# Patient Record
Sex: Female | Born: 1980 | Race: White | Hispanic: No | Marital: Single | Smoking: Current every day smoker
Health system: Southern US, Community
[De-identification: ages and names within clinical notes are randomized; demographics above are authoritative.]

## PROBLEM LIST (undated history)

## (undated) DIAGNOSIS — R569 Unspecified convulsions: Secondary | ICD-10-CM

## (undated) DIAGNOSIS — I82409 Acute embolism and thrombosis of unspecified deep veins of unspecified lower extremity: Secondary | ICD-10-CM

---

## 2011-07-30 ENCOUNTER — Emergency Department: Payer: Self-pay | Admitting: Emergency Medicine

## 2011-10-27 ENCOUNTER — Emergency Department: Payer: Self-pay | Admitting: Unknown Physician Specialty

## 2012-02-29 ENCOUNTER — Emergency Department: Payer: Self-pay | Admitting: *Deleted

## 2012-09-26 ENCOUNTER — Emergency Department: Payer: Self-pay | Admitting: Emergency Medicine

## 2012-09-26 LAB — RAPID INFLUENZA A&B ANTIGENS

## 2012-10-19 ENCOUNTER — Emergency Department: Payer: Self-pay | Admitting: Emergency Medicine

## 2013-06-29 ENCOUNTER — Encounter (HOSPITAL_COMMUNITY): Payer: Self-pay | Admitting: Emergency Medicine

## 2013-06-29 ENCOUNTER — Emergency Department (HOSPITAL_COMMUNITY)
Admission: EM | Admit: 2013-06-29 | Discharge: 2013-06-29 | Disposition: A | Discharge status: Home / Self Care | Payer: Self-pay | Attending: Emergency Medicine | Admitting: Emergency Medicine

## 2013-06-29 DIAGNOSIS — Z79899 Other long term (current) drug therapy: Secondary | ICD-10-CM | POA: Insufficient documentation

## 2013-06-29 DIAGNOSIS — IMO0001 Reserved for inherently not codable concepts without codable children: Secondary | ICD-10-CM | POA: Insufficient documentation

## 2013-06-29 DIAGNOSIS — Z86718 Personal history of other venous thrombosis and embolism: Secondary | ICD-10-CM | POA: Insufficient documentation

## 2013-06-29 DIAGNOSIS — M25469 Effusion, unspecified knee: Secondary | ICD-10-CM | POA: Insufficient documentation

## 2013-06-29 DIAGNOSIS — I809 Phlebitis and thrombophlebitis of unspecified site: Secondary | ICD-10-CM | POA: Insufficient documentation

## 2013-06-29 DIAGNOSIS — Z8669 Personal history of other diseases of the nervous system and sense organs: Secondary | ICD-10-CM | POA: Insufficient documentation

## 2013-06-29 DIAGNOSIS — Z791 Long term (current) use of non-steroidal anti-inflammatories (NSAID): Secondary | ICD-10-CM | POA: Insufficient documentation

## 2013-06-29 DIAGNOSIS — Z86711 Personal history of pulmonary embolism: Secondary | ICD-10-CM | POA: Insufficient documentation

## 2013-06-29 DIAGNOSIS — M79609 Pain in unspecified limb: Secondary | ICD-10-CM

## 2013-06-29 DIAGNOSIS — F17200 Nicotine dependence, unspecified, uncomplicated: Secondary | ICD-10-CM | POA: Insufficient documentation

## 2013-06-29 HISTORY — DX: Acute embolism and thrombosis of unspecified deep veins of unspecified lower extremity: I82.409

## 2013-06-29 HISTORY — DX: Unspecified convulsions: R56.9

## 2013-06-29 MED ORDER — RIVAROXABAN 15 MG PO TABS: 15.0000 mg | ORAL_TABLET | Freq: Two times a day (BID) | ORAL | Status: DC

## 2013-06-29 MED ORDER — NAPROXEN 500 MG PO TABS: 500.0000 mg | ORAL_TABLET | Freq: Two times a day (BID) | ORAL | Status: DC

## 2013-06-29 NOTE — ED Notes (Signed)
Pt states a week ago had pain and lump to right leg posterior to knee, pain progressively got worse and traveled down towards foot.

## 2013-06-29 NOTE — ED Provider Notes (Signed)
CSN: [id number]     Arrival date & time 06/29/13  1321 History   First MD Initiated Contact with Patient 06/29/13 1632     Chief Complaint  Patient presents with  . Leg Pain   (Consider location/radiation/quality/duration/timing/severity/associated sxs/prior Treatment) The history is provided by the patient and medical records. No language interpreter was used.    Morgan Harding is a 32 y.o. 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  Pt states. She began with a lump on the right posterior leg one week ago which has progressively gotten worse and traveled down to the foot. Nothing makes the symptoms better or worse.  Pt denies fever, chills, headache, CP, SOB, palpitations, abd pain, nausea, vomiting, diarrhea, weakness, dizziness, syncope. Pt without PCP.     Past Medical History  Diagnosis Date  . Seizures   . DVT (deep venous thrombosis)    History reviewed. No pertinent past surgical history. History reviewed. No pertinent family history. History  Substance Use Topics  . Smoking status: Current Every Day Smoker  . Smokeless tobacco: Not on file  . Alcohol Use: Yes     Comment: occ   OB History   Grav Para Term Preterm Abortions TAB SAB Ect Mult Living                 Review of Systems  Constitutional: Negative for fever, diaphoresis, appetite change, fatigue and unexpected weight change.  HENT: Negative for mouth sores and neck stiffness.   Eyes: Negative for visual disturbance.  Respiratory: Negative for cough, chest tightness, shortness of breath and wheezing.   Cardiovascular: Positive for leg swelling. Negative for chest pain.  Gastrointestinal: Negative for nausea, vomiting, abdominal pain, diarrhea and constipation.  Endocrine: Negative for  polydipsia, polyphagia and polyuria.  Genitourinary: Negative for dysuria, urgency, frequency and hematuria.  Musculoskeletal: Positive for myalgias. Negative for back pain.  Skin: Positive for color change. Negative for rash.  Allergic/Immunologic: Negative for immunocompromised state.  Neurological: Negative for syncope, light-headedness and headaches.  Hematological: Does not bruise/bleed easily.  Psychiatric/Behavioral: Negative for sleep disturbance. The patient is not nervous/anxious.     Allergies  Review of patient's allergies indicates no known allergies.  Home Medications   Current Outpatient Rx  Name  Route  Sig  Dispense  Refill  . folic acid (FOLVITE) 800 MCG tablet   Oral   Take 800 mcg by mouth daily.         . Multiple Vitamin (MULTIVITAMIN WITH MINERALS) TABS tablet   Oral   Take 1 tablet by mouth daily.         Marland Kitchen omeprazole (PRILOSEC) 20 MG capsule   Oral   Take 20 mg by mouth daily.         . naproxen (NAPROSYN) 500 MG tablet   Oral   Take 1 tablet (500 mg total) by mouth 2 (two) times daily with a meal.   30 tablet   0   . Rivaroxaban (XARELTO) 15 MG TABS tablet   Oral   Take 1 tablet (15 mg total) by mouth 2 (two) times daily.   30 tablet   0    BP 107/44  Pulse 85  Temp(Src) 98.2 F (36.8 C) (Oral)  Resp 20  SpO2 97% Physical Exam  Nursing note  and vitals reviewed. Constitutional: She is oriented to person, place, and time. She appears well-developed and well-nourished. No distress.  Awake, alert, nontoxic appearance  HENT:  Head: Normocephalic and atraumatic.  Nose: Nose normal.  Mouth/Throat: Uvula is midline, oropharynx is clear and moist and mucous membranes are normal. Mucous membranes are not dry. No oropharyngeal exudate, posterior oropharyngeal edema, posterior oropharyngeal erythema or tonsillar abscesses.  Eyes: Conjunctivae are normal. Pupils are equal, round, and reactive to light. No scleral icterus.  Neck: Normal range  of motion. Neck supple.  Cardiovascular: Normal rate, regular rhythm, normal heart sounds and intact distal pulses.   No murmur heard. No tachycardia - HR 88 on exam  Pulmonary/Chest: Effort normal and breath sounds normal. No respiratory distress. She has no wheezes. She has no rales.  No tachypnea  Abdominal: Soft. Bowel sounds are normal. She exhibits no distension. There is no tenderness. There is no rebound.  obese  Musculoskeletal: Normal range of motion. She exhibits edema and tenderness.       Right lower leg: She exhibits tenderness, swelling and edema. She exhibits no bony tenderness, no deformity and no laceration.  Mild pain to palpation of the medial right calf;  mild nonpitting edema of the right lower leg  Lymphadenopathy:    She has no cervical adenopathy.  Neurological: She is alert and oriented to person, place, and time. She exhibits normal muscle tone. Coordination normal.  Speech is clear and goal oriented Moves extremities without ataxia  Skin: Skin is warm and dry. She is not diaphoretic. There is erythema.  5cm x 8 cm area of mild erythema on the medial right calf without induration, fluctuance, drainage or evidence of abscess  Psychiatric: She has a normal mood and affect.    ED Course  Procedures (including critical care time) Labs Review Labs Reviewed - No data to display Imaging Review No results found.  Venous duplex with: Right: Superficial thrombosis noted in the GSV from prox/mid thigh to mid calf. No evidence of DVT.   MDM   1. Superficial thrombophlebitis    [name] presents with concerns for a DVT due to right leg pain with mild swelling x 1 week with hx of the same.    Pt without tachycardia, hypoxia, CP, SOB or hemoptysis; no concern for PE.  Superficial thrombosis found in the great saphenous vein extending from mid thigh to calf.  Patient with history of DVT with PE and significant superficial thrombosed by this in the great saphenous  vein gives rise to concern for possible development of DVT. Will begin on Xarelto and have her followup with the community wellness clinic for further evaluation and treatment options. I have also discussed reasons to return immediately to the ER.  Patient expresses understanding and agrees with plan.  BP 107/44  Pulse 85  Temp(Src) 98.2 F (36.8 C) (Oral)  Resp 20  SpO2 97%    Dierdre Forth, PA-C 06/29/13 1820

## 2013-06-29 NOTE — ED Notes (Signed)
Pt c/o right leg pain with some swelling x 1 week; pt sts hx of DVT and feels same

## 2013-06-29 NOTE — Progress Notes (Signed)
VASCULAR LAB PRELIMINARY  PRELIMINARY  PRELIMINARY  PRELIMINARY  Right lower extremity venous duplex completed.    Preliminary report:  Right:  Superficial thrombosis noted in the GSV from prox/mid thigh to mid calf.   No evidence of DVT.  Alitza Cowman, RVT 06/29/2013, 2:49 PM

## 2013-06-29 NOTE — ED Provider Notes (Signed)
Medical screening examination/treatment/procedure(s) were performed by non-physician practitioner and as supervising physician I was immediately available for consultation/collaboration.   Glynn Octave, MD 06/29/13 [phone]

## 2013-06-29 NOTE — Discharge Instructions (Signed)
1. Medications: naprosyn; usual home medications 2. Treatment: rest, drink plenty of fluids, extremity elevation (to waist level), warm compresses; NSAIDs; continue to remain ambulatory  3. Follow Up: Please followup with your primary doctor for discussion of your diagnoses and further evaluation after today's visit; if you do not have a primary care doctor use the resource guide provided to find one;     Phlebitis Phlebitis is a redness, tenderness and soreness (inflammation) in a vein. This can occur in your arms, legs, or torso (trunk), as well as deeper inside your body.  CAUSES  Phlebitis can be triggered by multiple factors. These include:  Reduced (restricted) blood flow through your veins. This happens with prolonged bed rest, long distance travel, injury or surgery. Being overweight (obese) and pregnant can also restrict blood flow and lead to phlebitis.  Putting a catheter in the vein (intravenous or IV) and giving certain medications through in the vein (intravenously).  Cancer and cancer treatment.  Use of illegal intravenous drugs.  Inflammatory diseases.  Inherited (genetic) diseases that increase the risk for blood clots.  Hormone therapy (such as birth control pills). SYMPTOMS   Red, tender, swollen, painful area on your skin.  Usually, the area will be long and narrow.  Low grade fever.  Significant firmness along the center of this area. This can indicate that a blood clot has formed.  Surrounding redness or a high fever, which can indicate an infection (cellulitis). DIAGNOSIS   The appearance of your condition and your symptoms will cause your caregiver to suspect phlebitis. Usually, this is enough for a diagnosis.  Your caregiver may request blood tests or an ultrasound test of the area to be sure you do not have an infection or a blood clot. Blood tests and discussing your family history may also indicate if you have an underlying genetic disease that  causes blood clots.  Occasionally, a piece of tissue is taken from the body (biopsy) if an unusual cause of phlebitis is suspected. TREATMENT   Raise (elevate) the affected area above the level of the heart.  Apply a warm compress or heating pad for 20 minutes, 3 or 4 times a day. If you use an electric heating pad, follow the directions so you do not burn yourself.  Anti-inflammatory medications are usually recommended. Follow your caregiver's directions.  Any IV catheter, if present, will be removed by your caregiver.  Your caregiver may prescribe medicines that kill germs (antibiotics) if an infection is present.  Your caregiver may recommend blood thinners if a blood clot is suspected or present.  Support stockings or bandages may be helpful, depending on the cause and location of the phlebitis.  Surgery may be needed to remove very damaged sections of vein, but this is rare. HOME CARE INSTRUCTIONS   Take medications exactly as prescribed.  Follow up with your caregiver as directed.  Use support stockings or bandages if advised. These will speed healing and prevent recurrence.  If you are on blood thinners:  Do follow-up blood tests exactly as directed.  Check with your caregiver before using any new medications.  Wear a pendant to show that you are on blood thinners.  For phlebitis in the legs:  Avoid prolonged standing or bed rest.  Keep your legs moving. Raise your legs with sitting or lying.  Do not smoke.  Women, particularly those over the age of 15, should consider the risks and benefits of taking the contraceptive pill. This kind of hormone treatment can  increase your risk for blood clots. SEEK MEDICAL CARE IF:   You have unusual bruising or any bleeding problems.  Swelling or pain in your affected arm or leg is not gradually improving.  You are on anti-inflammatory medication and you develop belly (abdominal) pain. SEEK IMMEDIATE MEDICAL CARE IF:    An unexplained oral temperature above 100.5 F (38.1 C) develops.  You have sudden onset of chest pain or difficulty breathing. Document Released: 10/15/2001 Document Revised: 01/13/2012 Document Reviewed: 07/17/2009 [facility] Patient Information 2014 [place], [place].   RESOURCE GUIDE  Chronic Pain Problems: Contact [facility]  [phone] Patients need to be referred by their primary care doctor.  Insufficient Money for Medicine: Contact United Way:  call [phone]  No Primary Care Doctor: - Call Health Connect  [phone] - can help you locate a primary care doctor that  accepts your insurance, provides certain services, etc. - Physician Referral Service- [phone]  Agencies that provide inexpensive medical care: - [facility]  [phone] - [facility]  [phone] - [facility]  [phone] - Women's Clinic  [phone] - Planned Parenthood  [phone] [facility]  [phone]  Medicaid-accepting [facility] Providers: - [facility]- [address]  [phone], Mon-Fri 9am-7pm, Sat 9am-1pm - [facility]- [address] [place], Suite [place]  [phone] - [facility]- [address], [address] [place]  [phone] [facility]- [address]  [phone] - Renaye Rakers- [address] [place], [address], [phone]  Only accepts [place] patients after they have their name  applied to their card  Self Pay (no insurance) in [place]: - Sickle Cell Patients - [facility]  [address] [place], [phone] - [facility]- [address] [place]  [phone]       [facility] [place]- [address]       -     [facility]- see information above (Speak to [employer] if you do not have insurance)       -  [facility]- [address],   [phone]       -  [facility]- [address] [phone]       -  Dr Julio Sicks-  [address], [place], [phone]       -  [facility] - [address], [phone]       -  [facility] [place] [facility]- [address] [phone], also [address], [phone]       -     [facility]- [address] [place], [phone], 1st & 3rd Saturday         every month, 10am-1pm  -     [facility]   [address] [place], [place].   Phone:  [phone], Fax:  [phone]. Hours of Operation:  9 am - 6 pm, M-F.  -     [facility]   [address],    Phone: [phone], Fax: [phone]. Hours of Operation:  8:30 am - 5:30 pm, M-F.  [facility] [address] [place], [place] [address] [phone]  [facility] [address] [place], [place]  [address] [phone]  1) Find a Doctor and Pay Out of Pocket Although you won't have to find out who is covered by your insurance plan, it is a good idea to ask around and get recommendations. You will then need to call the office and see if the doctor you have chosen will accept you as a new patient and what types of options they offer for patients who are self-pay. Some doctors offer discounts or will set up payment plans for their patients who do not have insurance, but you will need to ask so you aren't surprised when you get to your appointment.  2) Contact Your Local Health Department Not all health departments have doctors that can see patients for sick visits, but many do, so it is worth a call to see if yours does. If you don't know where your local health department is, you can check in your phone book. The CDC also has a tool to help you locate your state's health department, and many state websites also have listings of all of their local health departments.  3) Find a Walk-in  Clinic If your illness is not likely to be very severe or complicated, you may want to try a walk in clinic. These are popping up all over the country in pharmacies, drugstores, and shopping centers. They're usually staffed by nurse practitioners or physician assistants that have been trained to treat common illnesses and complaints. They're usually fairly quick and inexpensive. However, if you have serious medical issues or chronic medical problems, these are probably not your best option  STD Testing - [facility] Department of [facility] [place], STD Clinic, [address] [place], phone [phone] or [phone].  Monday - Friday, call for an appointment. [facility] Department of [employer], STD Clinic, [address], [place], phone [phone] or [phone].  Monday - Friday, call for an appointment.  Abuse/Neglect: [facility] Child Abuse Hotline 605-135-4032 [facility] Child Abuse Hotline 253-015-8295 (After Hours)  Emergency Shelter:  [facility] [phone]  Maternity Homes: - [facility] [place] [facility] [phone] - [facility] [phone]  MRSA Hotline #:   469-648-6186  Dental Assistance If unable to pay or uninsured, contact:  [facility]. to become qualified for the adult dental clinic.  Patients with Medicaid: [facility] [phone] [address], [phone] [address] [phone]  If unable to pay, or uninsured, contact [facility] [phone] in [place], [phone] in [facility]) to become qualified for the adult dental clinic  [facility] [address] [place], [place] [address] [phone] [email or web address]  Other Proofreader Services: - [facility]- [address] [place], [place], [place], [address], [phone], Ext. 123, 2nd and 4th Thursday of the month at 6:30am.  10 clients each  day by appointment, can sometimes see walk-in patients if someone does not show for an appointment. [facility]- [address] [place], [place], [address], [phone] - [facility] [address] [place], [place], [address], [phone] - [facility]- [phone] [facility] Health Department- [phone] [facility] Health [phone]       Behavioral Health Resources in the [facility]  Intensive Outpatient Programs: [facility]      [address] [place], [place] [phone] Both a day and evening program  [facility] Outpatient     [address]        [place], [place] [address] [phone]         ADS: [facility] Drug S[facility] [address] [place] [phone]  [facility] Mental Health ACCESS LINE: 331-116-1997 or [phone] [place], [place] [address] [email or web address]   Substance Abuse Resources: - Alcohol and Drug Services  [phone]) [phone] - [facility] [phone] - [facility] [phone] Floydene Flock ([phone] - Residential & Outpatient Substance Abuse Program  ([phone]  Psychological Services: [facility]  [phone] [facility]  [phone] - [facility], [phone] [place]. [address] [place], ACCESS LINE: 4042578605 or 3010448960, [email or web address]  Mobile Crisis Teams:                                        Therapeutic Alternatives         Mobile Crisis Care Unit [phone]             [facility] [address] Ginette Otto [phone]                                         Interventionist [address] [place] [address] [place] [phone]  Self-Help/Support Groups: Mental Health Assoc. of [employer] of support  groups (918)687-3761 (call for more info)  Narcotics Anonymous (NA) [facility]aring [facility] [address] [place] - 2 meetings at this location  Residential Treatment Programs:  [facility]      [address]        [place]       [phone]         [facility] [address] [place] [address] [place], [place]  [address] [phone]  [facility]  [address] [place], [place] [address] [phone] Admissions: 8am-3pm M-F  [facility]     [address]        [place], [place] [address]       [phone]         [facility] [address] [place], [place] [phone]  [facility] [address] [place], [place] [phone]  [facility] - Intensive Outpatient      [address]     [place], [place]       [phone]         [facility] ([facility]     [address] [place], [place] [phone] or [phone]  Residential Treatment Services (RTS), Medicaid [address] [place], [place] [phone]  Fellowship [address]                                               [address] [place] [phone]  [facility] Resources: Estate manager/land agent [phone]               General Therapy  Angie Fava, PhD        [address] [place], [place] [address]         [phone]   Insurance  [facility]   [address] [place], [place] [address] [phone]  [facility] [address] [place], [place] [address] [phone] Insurance/Medicaid/sponsorship through [facility] and Families                                              [address]                                        [place], [place] [address]    Therapy/tele-psych/case         [phone]          [facility] [address], [place]  [address]  Adolescent/group home/case management [phone]                                           Creola Corn PhD       General therapy       Insurance   [phone]         Dr. Lolly Mustache, Insurance, M-F [phone]  [facility]  [employer] [facility] Dept. [address]                 [address]         [address] [place] [address]  Blondell Reveal Phone:  [phone]                                  Phone:  [phone]                   Phone:  [phone]  [facility] Mental Health, [phone] - [facility]- [phone]       -     [facility] in [place], [address],             [phone], Insurance  [place] Child Abuse Hotline 617-274-0974 or 4358843689 (After Hours)

## 2013-11-03 ENCOUNTER — Encounter (HOSPITAL_COMMUNITY): Payer: Self-pay | Admitting: Emergency Medicine

## 2013-11-03 ENCOUNTER — Emergency Department (HOSPITAL_COMMUNITY)
Admission: EM | Admit: 2013-11-03 | Discharge: 2013-11-03 | Disposition: A | Discharge status: Home / Self Care | Payer: BC Managed Care – PPO | Attending: Emergency Medicine | Admitting: Emergency Medicine

## 2013-11-03 DIAGNOSIS — Z3202 Encounter for pregnancy test, result negative: Secondary | ICD-10-CM | POA: Insufficient documentation

## 2013-11-03 DIAGNOSIS — Z79899 Other long term (current) drug therapy: Secondary | ICD-10-CM | POA: Insufficient documentation

## 2013-11-03 DIAGNOSIS — N390 Urinary tract infection, site not specified: Secondary | ICD-10-CM | POA: Insufficient documentation

## 2013-11-03 DIAGNOSIS — N938 Other specified abnormal uterine and vaginal bleeding: Secondary | ICD-10-CM | POA: Insufficient documentation

## 2013-11-03 DIAGNOSIS — F17200 Nicotine dependence, unspecified, uncomplicated: Secondary | ICD-10-CM | POA: Insufficient documentation

## 2013-11-03 DIAGNOSIS — R000 Tachycardia, unspecified: Secondary | ICD-10-CM | POA: Insufficient documentation

## 2013-11-03 DIAGNOSIS — Z8669 Personal history of other diseases of the nervous system and sense organs: Secondary | ICD-10-CM | POA: Insufficient documentation

## 2013-11-03 DIAGNOSIS — R5381 Other malaise: Secondary | ICD-10-CM | POA: Insufficient documentation

## 2013-11-03 DIAGNOSIS — N949 Unspecified condition associated with female genital organs and menstrual cycle: Secondary | ICD-10-CM | POA: Insufficient documentation

## 2013-11-03 DIAGNOSIS — Z86718 Personal history of other venous thrombosis and embolism: Secondary | ICD-10-CM | POA: Insufficient documentation

## 2013-11-03 LAB — CBC
HCT: 41 % (ref 36.0–46.0)
Hemoglobin: 14.2 g/dL (ref 12.0–15.0)
MCHC: 34.6 g/dL (ref 30.0–36.0)
RBC: 4.64 MIL/uL (ref 3.87–5.11)

## 2013-11-03 LAB — URINALYSIS, ROUTINE W REFLEX MICROSCOPIC
Glucose, UA: NEGATIVE mg/dL
Protein, ur: NEGATIVE mg/dL
pH: 8 (ref 5.0–8.0)

## 2013-11-03 LAB — WET PREP, GENITAL
Clue Cells Wet Prep HPF POC: NONE SEEN
Trich, Wet Prep: NONE SEEN
Yeast Wet Prep HPF POC: NONE SEEN

## 2013-11-03 LAB — BASIC METABOLIC PANEL
BUN: 11 mg/dL (ref 6–23)
CO2: 27 mEq/L (ref 19–32)
Chloride: 103 mEq/L (ref 96–112)
Glucose, Bld: 99 mg/dL (ref 70–99)
Potassium: 4.4 mEq/L (ref 3.7–5.3)

## 2013-11-03 LAB — URINE MICROSCOPIC-ADD ON

## 2013-11-03 LAB — PREGNANCY, URINE: Preg Test, Ur: NEGATIVE

## 2013-11-03 MED ORDER — SULFAMETHOXAZOLE-TMP DS 800-160 MG PO TABS: 1.0000 | ORAL_TABLET | Freq: Two times a day (BID) | ORAL | Status: DC

## 2013-11-03 NOTE — ED Notes (Signed)
Pt reports irregular vaginal bleeding and has been bleeding x 2 weeks. No distress noted at triage.

## 2013-11-03 NOTE — ED Provider Notes (Signed)
CSN: [id number]     Arrival date & time 11/03/13  1530 History   First MD Initiated Contact with Patient 11/03/13 1701     Chief Complaint  Patient presents with  . Vaginal Bleeding   (Consider location/radiation/quality/duration/timing/severity/associated sxs/prior Treatment) HPI Comments: Patient is otherwise healthy 32 year old female with PMHx significant for irregular menses presents to the ED with two week history of bleeding vaginally.  She states that her previous period was on 11/29 but that about 2 weeks after she began with a lighter than normal period.  She states that this has now progressed to what would be a normal flow for her.  She reports no light-headedness, near syncope, but generalized fatigue.  She reports that she possibly could be pregnant but that she took 2 home pregnancy tests which were negative.  She denies fever, chills, reports mild crampy abdominal pain (like her normal menstrual cycle), dysuria, hematuria, vaginal discharge or history of STD.  She states that she and her husband are trying to become pregnant.  Patient is a 32 y.o. female presenting with vaginal bleeding. The history is provided by the patient. No language interpreter was used.  Vaginal Bleeding Quality:  Clots and typical of menses Severity:  Moderate Onset quality:  Gradual Duration:  2 weeks Timing:  Constant Progression:  Worsening Chronicity:  New Menstrual history:  Irregular Number of pads used:  4 Possible pregnancy: unknown.   Context: spontaneously   Relieved by:  Nothing Worsened by:  Nothing tried Ineffective treatments:  None tried Associated symptoms: fatigue   Associated symptoms: no abdominal pain, no back pain, no dizziness, no dyspareunia, no dysuria, no fever, no nausea and no vaginal discharge   Risk factors: no bleeding disorder, no new sexual partner and no STD     Past Medical History  Diagnosis Date  . Seizures   . DVT (deep venous thrombosis)    History  reviewed. No pertinent past surgical history. History reviewed. No pertinent family history. History  Substance Use Topics  . Smoking status: Current Every Day Smoker  . Smokeless tobacco: Not on file  . Alcohol Use: Yes     Comment: occ   OB History   Grav Para Term Preterm Abortions TAB SAB Ect Mult Living                 Review of Systems  Constitutional: Positive for fatigue. Negative for fever.  Gastrointestinal: Negative for nausea and abdominal pain.  Genitourinary: Positive for vaginal bleeding. Negative for dysuria, vaginal discharge and dyspareunia.  Musculoskeletal: Negative for back pain.  Neurological: Negative for dizziness.  All other systems reviewed and are negative.    Allergies  Review of patient's allergies indicates no known allergies.  Home Medications   Current Outpatient Rx  Name  Route  Sig  Dispense  Refill  . omeprazole (PRILOSEC) 20 MG capsule   Oral   Take 20 mg by mouth daily.          BP 160/82  Pulse 110  Temp(Src) 98.2 F (36.8 C) (Oral)  Resp 19  Wt 260 lb (117.935 kg)  SpO2 96%  LMP 10/02/2013 Physical Exam  Nursing note and vitals reviewed. Constitutional: She is oriented to person, place, and time. She appears well-developed and well-nourished. No distress.  HENT:  Head: Normocephalic and atraumatic.  Right Ear: External ear normal.  Left Ear: External ear normal.  Nose: Nose normal.  Mouth/Throat: Oropharynx is clear and moist. No oropharyngeal exudate.  Eyes:  Conjunctivae are normal. Pupils are equal, round, and reactive to light. No scleral icterus.  Neck: Normal range of motion. Neck supple.  Cardiovascular: Regular rhythm and normal heart sounds.  Exam reveals no gallop and no friction rub.   No murmur heard. tachycardia  Pulmonary/Chest: Effort normal and breath sounds normal. No respiratory distress. She has no wheezes. She has no rales. She exhibits no tenderness.  Abdominal: Soft. Bowel sounds are normal. She  exhibits no distension. There is no tenderness. There is no rebound and no guarding.  Genitourinary: There is no rash or tenderness on the right labia. There is no rash or tenderness on the left labia. Uterus is not enlarged and not tender. Cervix exhibits no motion tenderness, no discharge and no friability. Right adnexum displays no mass, no tenderness and no fullness. Left adnexum displays no mass, no tenderness and no fullness. There is bleeding around the vagina. No tenderness around the vagina. No vaginal discharge found.  Small amount of blood in vaginal vault  Musculoskeletal: Normal range of motion. She exhibits no edema and no tenderness.  Lymphadenopathy:    She has no cervical adenopathy.  Neurological: She is alert and oriented to person, place, and time. She exhibits normal muscle tone. Coordination normal.  Skin: Skin is warm and dry. No rash noted. No erythema. No pallor.  Psychiatric: She has a normal mood and affect. Her behavior is normal. Judgment and thought content normal.    ED Course  Procedures (including critical care time) Labs Review Labs Reviewed  GC/CHLAMYDIA PROBE AMP  WET PREP, GENITAL  URINALYSIS, ROUTINE W REFLEX MICROSCOPIC  PREGNANCY, URINE  CBC  BASIC METABOLIC PANEL   Imaging Review No results found.  EKG Interpretation   None      Results for orders placed during the hospital encounter of 11/03/13  WET PREP, GENITAL      Result Value Range   Yeast Wet Prep HPF POC NONE SEEN  NONE SEEN   Trich, Wet Prep NONE SEEN  NONE SEEN   Clue Cells Wet Prep HPF POC NONE SEEN  NONE SEEN   WBC, Wet Prep HPF POC MODERATE (*) NONE SEEN  URINALYSIS, ROUTINE W REFLEX MICROSCOPIC      Result Value Range   Color, Urine YELLOW  YELLOW   APPearance HAZY (*) CLEAR   Specific Gravity, Urine 1.022  1.005 - 1.030   pH 8.0  5.0 - 8.0   Glucose, UA NEGATIVE  NEGATIVE mg/dL   Hgb urine dipstick LARGE (*) NEGATIVE   Bilirubin Urine NEGATIVE  NEGATIVE   Ketones,  ur NEGATIVE  NEGATIVE mg/dL   Protein, ur NEGATIVE  NEGATIVE mg/dL   Urobilinogen, UA 0.2  0.0 - 1.0 mg/dL   Nitrite NEGATIVE  NEGATIVE   Leukocytes, UA MODERATE (*) NEGATIVE  PREGNANCY, URINE      Result Value Range   Preg Test, Ur NEGATIVE  NEGATIVE  CBC      Result Value Range   WBC 9.9  4.0 - 10.5 K/uL   RBC 4.64  3.87 - 5.11 MIL/uL   Hemoglobin 14.2  12.0 - 15.0 g/dL   HCT 21.3  08.6 - 57.8 %   MCV 88.4  78.0 - 100.0 fL   MCH 30.6  26.0 - 34.0 pg   MCHC 34.6  30.0 - 36.0 g/dL   RDW 46.9  62.9 - 52.8 %   Platelets 215  150 - 400 K/uL  BASIC METABOLIC PANEL      Result Value  Range   Sodium 142  137 - 147 mEq/L   Potassium 4.4  3.7 - 5.3 mEq/L   Chloride 103  96 - 112 mEq/L   CO2 27  19 - 32 mEq/L   Glucose, Bld 99  70 - 99 mg/dL   BUN 11  6 - 23 mg/dL   Creatinine, Ser 1.61  0.50 - 1.10 mg/dL   Calcium 8.9  8.4 - 09.6 mg/dL   GFR calc non Af Amer >90  >90 mL/min   GFR calc Af Amer >90  >90 mL/min  URINE MICROSCOPIC-ADD ON      Result Value Range   Squamous Epithelial / LPF FEW (*) RARE   WBC, UA 7-10  <3 WBC/hpf   RBC / HPF TOO NUMEROUS TO COUNT  <3 RBC/hpf   Bacteria, UA FEW (*) RARE   Urine-Other MUCOUS PRESENT     No results found.   MDM  DUB UTI  Patient here with a long history of irregular menses but reports 2 weeks of bleeding and now with weakness, she was concerned about anemia but HGB here is normal.  No abdominal pain or pelvic pain with examination, labs stable with exception of UTI.  Patient is trying to get pregnant so will not offer birth control to control menses - she will follow up with her GYN.   Izola Price Marisue Humble, [place] 11/03/13 1918

## 2013-11-03 NOTE — ED Provider Notes (Signed)
Medical screening examination/treatment/procedure(s) were performed by non-physician practitioner and as supervising physician I was immediately available for consultation/collaboration.  EKG Interpretation   None        Doug Sou, MD 11/03/13 [phone]

## 2013-11-04 LAB — GC/CHLAMYDIA PROBE AMP
CT Probe RNA: NEGATIVE
GC Probe RNA: NEGATIVE

## 2013-11-04 LAB — URINE CULTURE
Colony Count: NO GROWTH
Culture: NO GROWTH

## 2013-11-30 IMAGING — US US EXTREM LOW VENOUS*L*
1 series · 17 of 24 positions shown · non-contrast
Comparison: none

REASON FOR EXAM: history of DVT and PE
COMMENTS:

PROCEDURE:     US  - US DOPPLER LOW EXTR LEFT  - February 29, 2012 [DATE]
RESULT:     Comparison: None

[Series 1: us extrem low venous*left* · 17 of 27 slices shown]
[im 1/27]
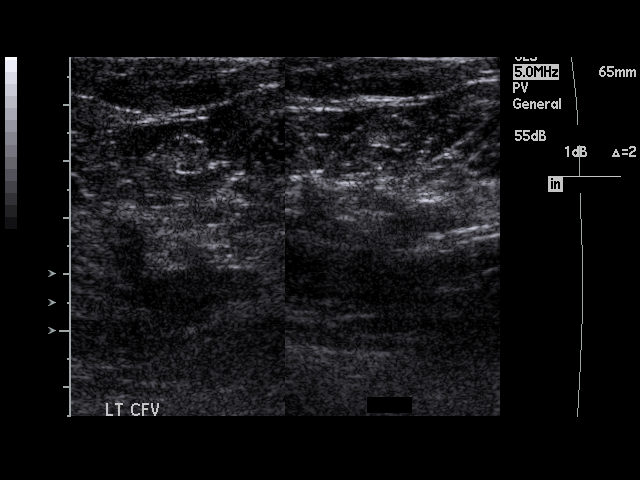
[im 3/27]
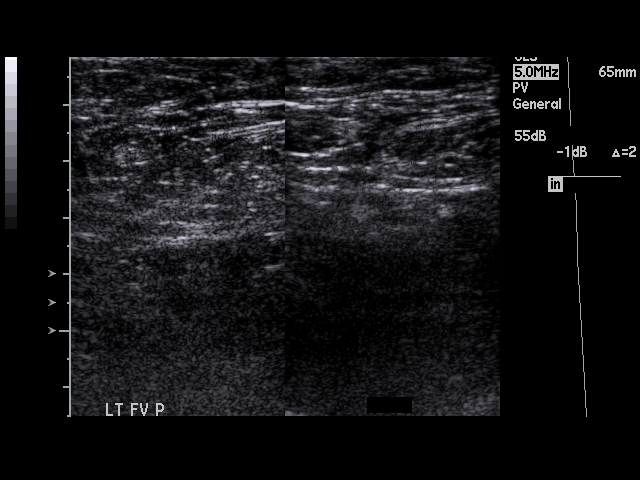
[im 4/27]
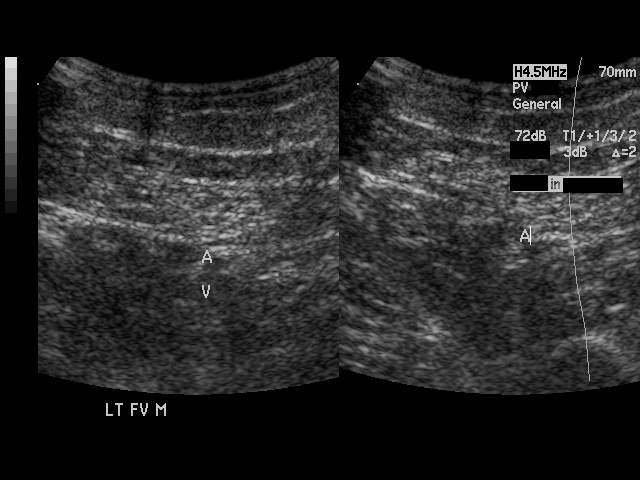
[im 5/27]
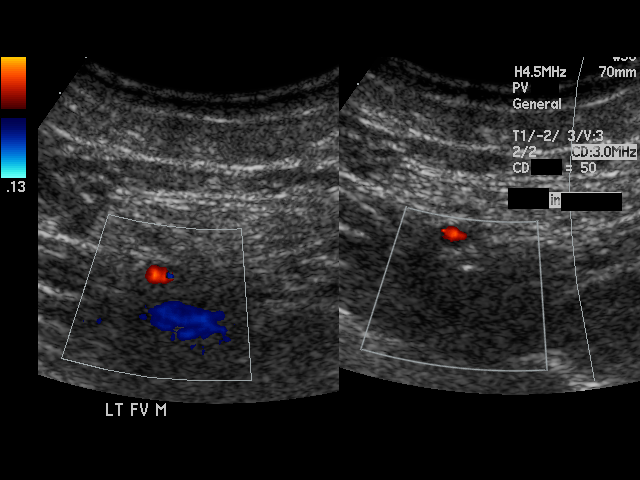
[im 7/27]
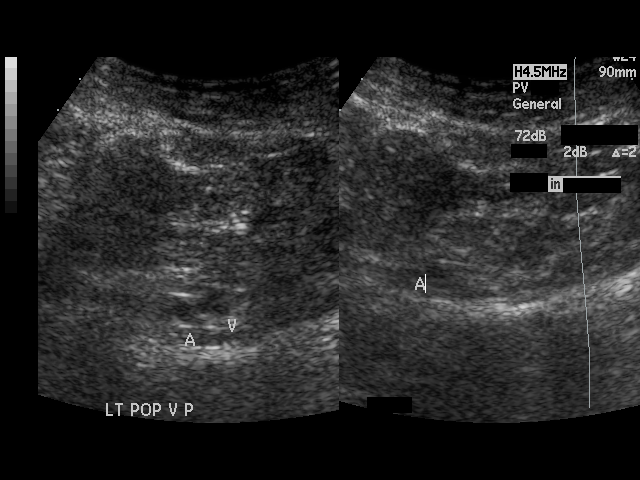
[im 8/27]
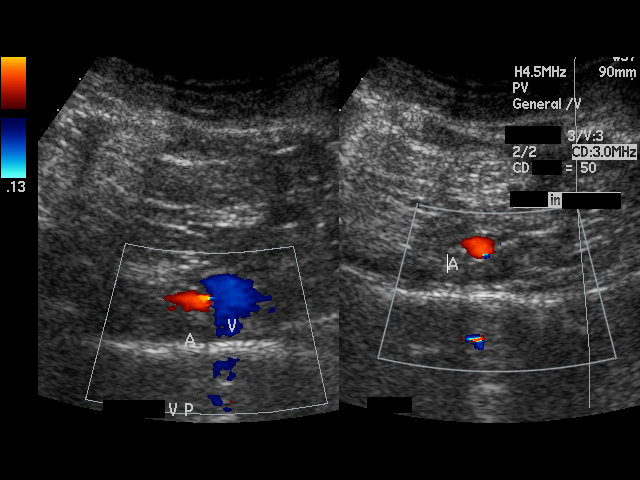
[im 11/27]
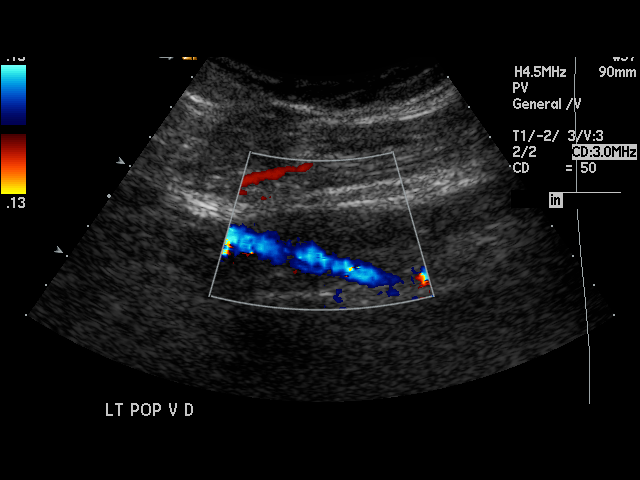
[im 12/27]
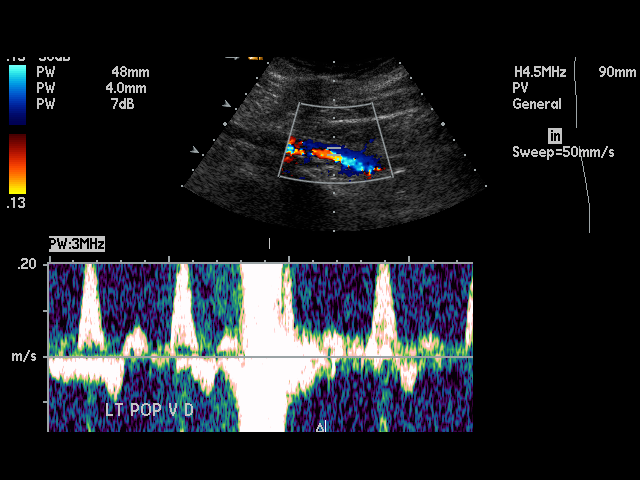
[im 14/27]
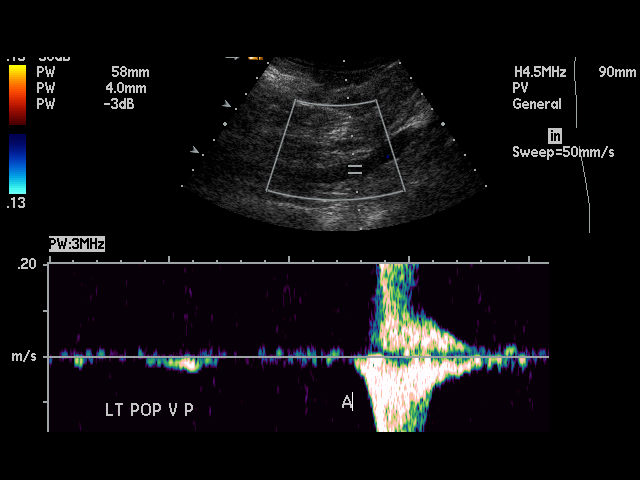
[im 15/27]
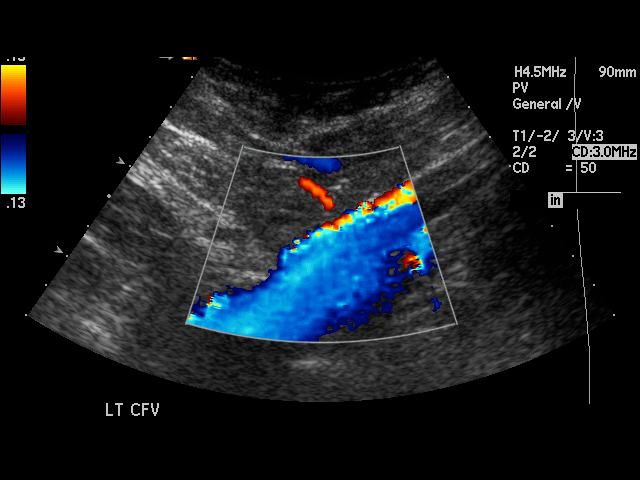
[im 16/27]
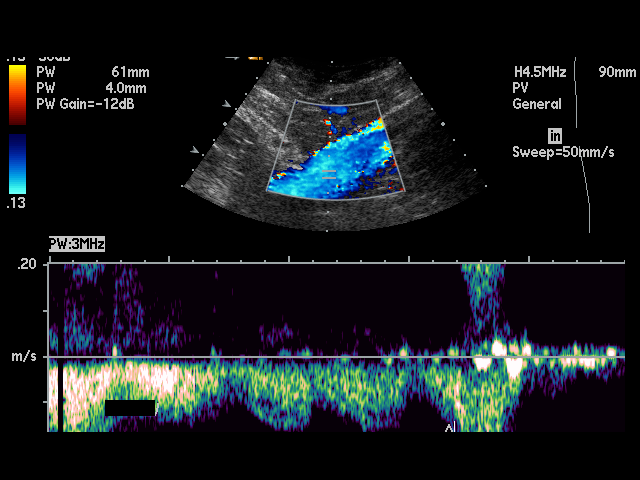
[im 19/27]
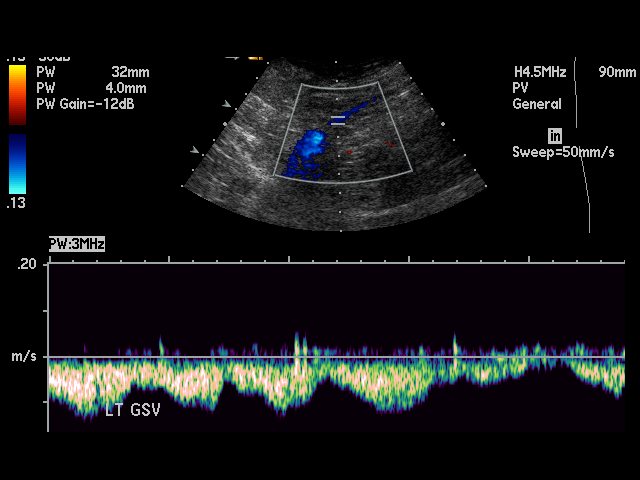
[im 20/27]
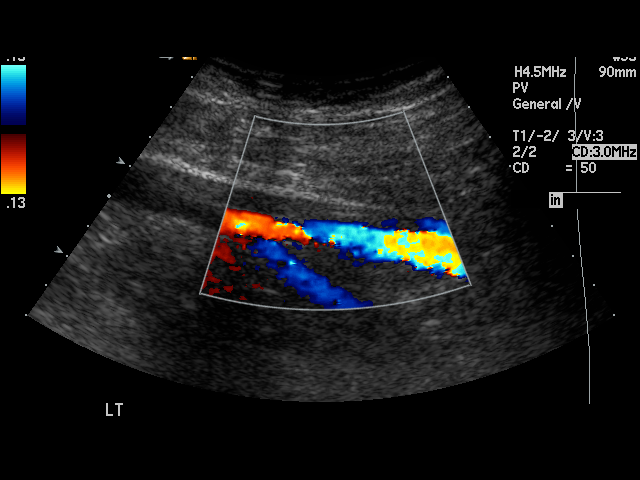
[im 22/27]
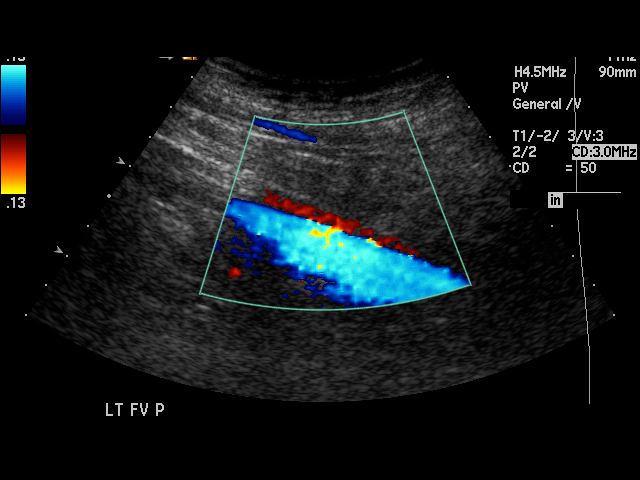
[im 23/27]
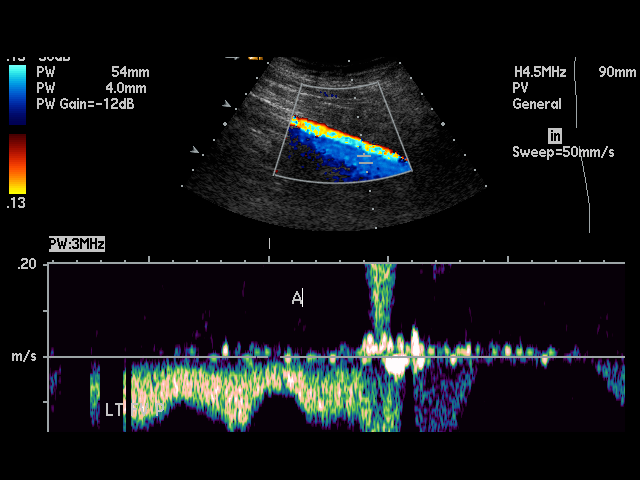
[im 24/27]
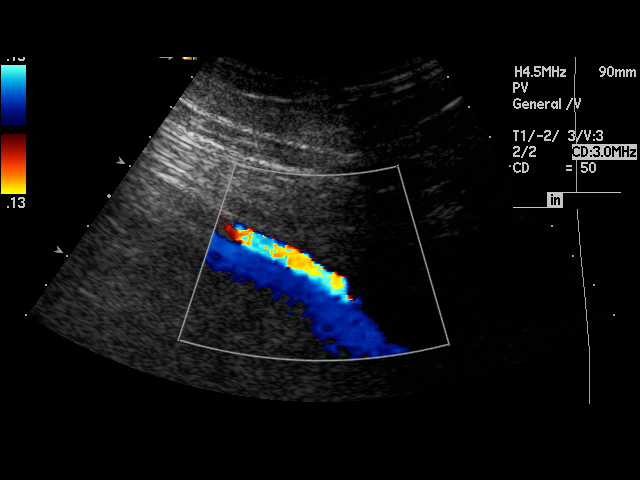
[im 27/27]
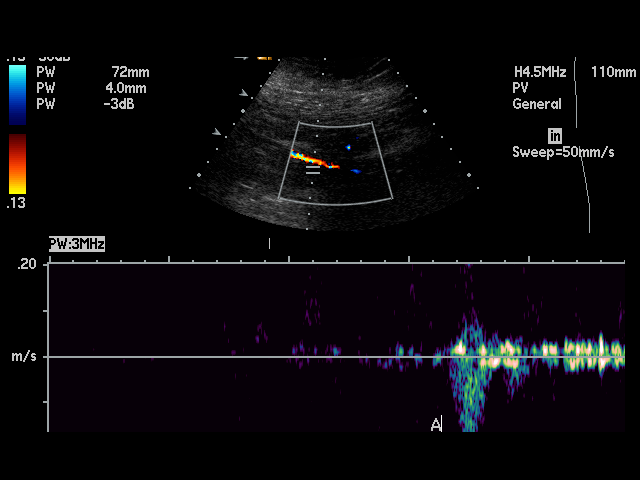

[17 of 24 positions shown; findings below may reference images not displayed]

FINDINGS: Multiple longitudinal and transverse gray-scale as well as color
and spectral Doppler images of the left lower extremity veins were obtained
from the common femoral veins through the popliteal veins.

The left common femoral, greater saphenous, femoral, popliteal veins, and
venous trifurcation are patent, demonstrating normal color-flow and
compressibility. No intraluminal thrombus is identified.There is normal
respiratory variation and augmentation demonstrated at all vein levels.
IMPRESSION: No evidence of DVT in the left lower extremity.

## 2014-01-24 ENCOUNTER — Emergency Department (HOSPITAL_COMMUNITY)
Admission: EM | Admit: 2014-01-24 | Discharge: 2014-01-24 | Disposition: A | Discharge status: Home / Self Care | Payer: BC Managed Care – PPO | Attending: Emergency Medicine | Admitting: Emergency Medicine

## 2014-01-24 ENCOUNTER — Encounter (HOSPITAL_COMMUNITY): Payer: Self-pay | Admitting: Emergency Medicine

## 2014-01-24 ENCOUNTER — Emergency Department (HOSPITAL_COMMUNITY): Payer: BC Managed Care – PPO

## 2014-01-24 DIAGNOSIS — Z86718 Personal history of other venous thrombosis and embolism: Secondary | ICD-10-CM | POA: Insufficient documentation

## 2014-01-24 DIAGNOSIS — Z79899 Other long term (current) drug therapy: Secondary | ICD-10-CM | POA: Insufficient documentation

## 2014-01-24 DIAGNOSIS — N938 Other specified abnormal uterine and vaginal bleeding: Secondary | ICD-10-CM | POA: Insufficient documentation

## 2014-01-24 DIAGNOSIS — F17200 Nicotine dependence, unspecified, uncomplicated: Secondary | ICD-10-CM | POA: Insufficient documentation

## 2014-01-24 DIAGNOSIS — G40919 Epilepsy, unspecified, intractable, without status epilepticus: Secondary | ICD-10-CM | POA: Insufficient documentation

## 2014-01-24 DIAGNOSIS — Z3202 Encounter for pregnancy test, result negative: Secondary | ICD-10-CM | POA: Insufficient documentation

## 2014-01-24 DIAGNOSIS — N949 Unspecified condition associated with female genital organs and menstrual cycle: Secondary | ICD-10-CM | POA: Insufficient documentation

## 2014-01-24 LAB — URINALYSIS, ROUTINE W REFLEX MICROSCOPIC
Bilirubin Urine: NEGATIVE
GLUCOSE, UA: NEGATIVE mg/dL
Ketones, ur: 15 mg/dL — AB
Nitrite: NEGATIVE
PROTEIN: 100 mg/dL — AB
SPECIFIC GRAVITY, URINE: 1.026 (ref 1.005–1.030)
Urobilinogen, UA: 0.2 mg/dL (ref 0.0–1.0)
pH: 7.5 (ref 5.0–8.0)

## 2014-01-24 LAB — WET PREP, GENITAL
CLUE CELLS WET PREP: NONE SEEN
TRICH WET PREP: NONE SEEN
Yeast Wet Prep HPF POC: NONE SEEN

## 2014-01-24 LAB — CBC WITH DIFFERENTIAL/PLATELET
Basophils Absolute: 0 10*3/uL (ref 0.0–0.1)
Basophils Relative: 0 % (ref 0–1)
EOS ABS: 0.2 10*3/uL (ref 0.0–0.7)
Eosinophils Relative: 3 % (ref 0–5)
HEMATOCRIT: 41.3 % (ref 36.0–46.0)
HEMOGLOBIN: 14.4 g/dL (ref 12.0–15.0)
LYMPHS ABS: 2.3 10*3/uL (ref 0.7–4.0)
Lymphocytes Relative: 32 % (ref 12–46)
MCH: 30.8 pg (ref 26.0–34.0)
MCHC: 34.9 g/dL (ref 30.0–36.0)
MCV: 88.2 fL (ref 78.0–100.0)
MONO ABS: 0.4 10*3/uL (ref 0.1–1.0)
MONOS PCT: 5 % (ref 3–12)
NEUTROS PCT: 60 % (ref 43–77)
Neutro Abs: 4.3 10*3/uL (ref 1.7–7.7)
Platelets: 194 10*3/uL (ref 150–400)
RBC: 4.68 MIL/uL (ref 3.87–5.11)
RDW: 12.8 % (ref 11.5–15.5)
WBC: 7.1 10*3/uL (ref 4.0–10.5)

## 2014-01-24 LAB — POC URINE PREG, ED: Preg Test, Ur: NEGATIVE

## 2014-01-24 LAB — BASIC METABOLIC PANEL
BUN: 10 mg/dL (ref 6–23)
CO2: 21 mEq/L (ref 19–32)
Calcium: 8.9 mg/dL (ref 8.4–10.5)
Chloride: 102 mEq/L (ref 96–112)
Creatinine, Ser: 0.52 mg/dL (ref 0.50–1.10)
GFR calc Af Amer: >90 mL/min (ref >90)
GLUCOSE: 140 mg/dL — AB (ref 70–99)
POTASSIUM: 4.4 meq/L (ref 3.7–5.3)
Sodium: 138 mEq/L (ref 137–147)

## 2014-01-24 LAB — URINE MICROSCOPIC-ADD ON

## 2014-01-24 NOTE — ED Notes (Signed)
Pt reports vaginal bleeding x 2 days. States that she has soaked 4 maxi pads in 2 days. Reports last menstrual cycle was on 01/04/14. Reports right and left lower abdominal pain that is cramping, sharp, shooting in nature. Denies nausea and vomiting with the pain.

## 2014-01-24 NOTE — ED Notes (Signed)
Patient transported to Ultrasound 

## 2014-01-24 NOTE — ED Provider Notes (Addendum)
CSN: [id number]     Arrival date & time 01/24/14  1402 History   First MD Initiated Contact with Patient 01/24/14 1630     Chief Complaint  Patient presents with  . Vaginal Bleeding     (Consider location/radiation/quality/duration/timing/severity/associated sxs/prior Treatment) Patient is a 33 y.o. female presenting with vaginal bleeding. The history is provided by the patient.  Vaginal Bleeding Quality:  Bright red and clots Severity:  Moderate Duration:  2 days Timing:  Constant Progression:  Worsening Chronicity:  New Menstrual history:  Irregular Number of pads used:  4 Possible pregnancy: no   Context: not genital trauma   Associated symptoms: abdominal pain   Associated symptoms: no dizziness, no dysuria, no fatigue and no vaginal discharge     Past Medical History  Diagnosis Date  . Seizures   . DVT (deep venous thrombosis)    History reviewed. No pertinent past surgical history. No family history on file. History  Substance Use Topics  . Smoking status: Current Every Day Smoker  . Smokeless tobacco: Not on file  . Alcohol Use: Yes     Comment: occ   OB History   Grav Para Term Preterm Abortions TAB SAB Ect Mult Living                 Review of Systems  Constitutional: Negative for fatigue.  Gastrointestinal: Positive for abdominal pain.  Genitourinary: Positive for vaginal bleeding. Negative for dysuria and vaginal discharge.  Neurological: Negative for dizziness.  All other systems reviewed and are negative.      Allergies  Review of patient's allergies indicates no known allergies.  Home Medications   Current Outpatient Rx  Name  Route  Sig  Dispense  Refill  . folic acid (FOLVITE) 1 MG tablet   Oral   Take 1 mg by mouth daily.         Marland Kitchen. MAGNESIUM PO   Oral   Take 1 tablet by mouth daily.         . Multiple Vitamin (MULTIVITAMIN WITH MINERALS) TABS tablet   Oral   Take 1 tablet by mouth daily.         Marland Kitchen. omeprazole (PRILOSEC)  20 MG capsule   Oral   Take 20 mg by mouth daily.         . phenytoin (DILANTIN) 100 MG ER capsule   Oral   Take 200-300 mg by mouth 2 (two) times daily. Take 200mg  by mouth in AM and 300mg  by mouth in PM          BP 123/61  Pulse 86  Temp(Src) 97.9 F (36.6 C) (Oral)  Resp 20  SpO2 95% Physical Exam  Nursing note and vitals reviewed. Constitutional: She is oriented to person, place, and time. She appears well-developed and well-nourished.  HENT:  Head: Normocephalic and atraumatic.  Eyes: Conjunctivae and EOM are normal. Pupils are equal, round, and reactive to light.  Neck: Normal range of motion. Neck supple.  Cardiovascular: Normal rate, regular rhythm, normal heart sounds and intact distal pulses.   No murmur heard. Pulmonary/Chest: Effort normal. No respiratory distress. She has no wheezes.  Abdominal: Soft. Bowel sounds are normal. She exhibits no distension. There is no tenderness. There is no rebound and no guarding.  Genitourinary: Vagina normal and uterus normal.  External exam - normal, no lesions, + blood - dark red Speculum exam: Pt has some white discharge, + dark red blood, and clots. Bimanual exam: Patient has no CMT, no  adnexal tenderness or fullness and cervical os is closed  Neurological: She is alert and oriented to person, place, and time.  Skin: Skin is warm and dry.    ED Course  Procedures (including critical care time) Labs Review Labs Reviewed  BASIC METABOLIC PANEL - Abnormal; Notable for the following:    Glucose, Bld 140 (*)    All other components within normal limits  URINALYSIS, ROUTINE W REFLEX MICROSCOPIC - Abnormal; Notable for the following:    Color, Urine RED (*)    APPearance CLOUDY (*)    Hgb urine dipstick LARGE (*)    Ketones, ur 15 (*)    Protein, ur 100 (*)    Leukocytes, UA SMALL (*)    All other components within normal limits  URINE MICROSCOPIC-ADD ON - Abnormal; Notable for the following:    Bacteria, UA FEW (*)     All other components within normal limits  GC/CHLAMYDIA PROBE AMP  WET PREP, GENITAL  CBC WITH DIFFERENTIAL  POC URINE PREG, ED   Imaging Review No results found.   EKG Interpretation None      MDM   Final diagnoses:  None    Pt with vaginal bleeding. Upreg is negative. US shows no acute findings - there is some endometrial swelling. Likely DUB. Gyne f/u given.  Derwood Kaplan, MD 01/26/14 1610  Derwood Kaplan, MD 01/26/14 [phone]

## 2014-01-24 NOTE — ED Notes (Signed)
Pt. Returned from ultrasound

## 2014-01-24 NOTE — ED Notes (Signed)
MD at bedside. 

## 2014-01-24 NOTE — Discharge Instructions (Signed)
Your blood level is pretty normal, despite the bleeding. Ultrasound reports is normal. See the Gynecologist soon. Return to there ER if with the bleeding, you feeling ike you are going to pass out. Drink plenty of fluids for now.   Menorrhagia Menorrhagia is the medical term for when your menstrual periods are heavy or last longer than usual. With menorrhagia, every period you have may cause enough blood loss and cramping that you are unable to maintain your usual activities. CAUSES  In some cases, the cause of heavy periods is unknown, but a number of conditions may cause menorrhagia. Common causes include:  A problem with the hormone-producing thyroid gland (hypothyroid).  Noncancerous growths in the uterus (polyps or fibroids).  An imbalance of the estrogen and progesterone hormones.  One of your ovaries not releasing an egg during one or more months.  Side effects of having an intrauterine device (IUD).  Side effects of some medicines, such as anti-inflammatory medicines or blood thinners.  A bleeding disorder that stops your blood from clotting normally. SIGNS AND SYMPTOMS  During a normal period, bleeding lasts between 4 and 8 days. Signs that your periods are too heavy include:  You routinely have to change your pad or tampon every 1 or 2 hours because it is completely soaked.  You pass blood clots larger than 1 inch (2.5 cm) in size.  You have bleeding for more than 7 days.  You need to use pads and tampons at the same time because of heavy bleeding.  You need to wake up to change your pads or tampons during the night.  You have symptoms of anemia, such as tiredness, fatigue, or shortness of breath. DIAGNOSIS  Your health care provider will perform a physical exam and ask you questions about your symptoms and menstrual history. Other tests may be ordered based on what the health care provider finds during the exam. These tests can include:  Blood tests To check if  you are pregnant or have hormonal changes, a bleeding or thyroid disorder, low iron levels (anemia), or other problems.  Endometrial biopsy Your health care provider takes a sample of tissue from the inside of your uterus to be examined under a microscope.  Pelvic ultrasound This test uses sound waves to make a picture of your uterus, ovaries, and vagina. The pictures can show if you have fibroids or other growths.  Hysteroscopy For this test, your health care provider will use a small telescope to look inside your uterus. Based on the results of your initial tests, your health care provider may recommend further testing. TREATMENT  Treatment may not be needed. If it is needed, your health care provider may recommend treatment with one or more medicines first. If these do not reduce bleeding enough, a surgical treatment might be an option. The best treatment for you will depend on:   Whether you need to prevent pregnancy.  Your desire to have children in the future.  The cause and severity of your bleeding.  Your opinion and personal preference.  Medicines for menorrhagia may include:  Birth control methods that use hormones These include the pill, skin patch, vaginal ring, shots that you get every 3 months, hormonal IUD, and implant. These treatments reduce bleeding during your menstrual period.  Medicines that thicken blood and slow bleeding.  Medicines that reduce swelling, such as ibuprofen.  Medicines that contain a synthetic hormone called progestin.   Medicines that make the ovaries stop working for a short time.  You may need surgical treatment for menorrhagia if the medicines are unsuccessful. Treatment options include:  Dilation and curettage (D&C) In this procedure, your health care provider opens (dilates) your cervix and then scrapes or suctions tissue from the lining of your uterus to reduce menstrual bleeding.  Operative hysteroscopy This procedure uses a tiny  tube with a light (hysteroscope) to view your uterine cavity and can help in the surgical removal of a polyp that may be causing heavy periods.  Endometrial ablation Through various techniques, your health care provider permanently destroys the entire lining of your uterus (endometrium). After endometrial ablation, most women have little or no menstrual flow. Endometrial ablation reduces your ability to become pregnant.  Endometrial resection This surgical procedure uses an electrosurgical wire loop to remove the lining of the uterus. This procedure also reduces your ability to become pregnant.  Hysterectomy Surgical removal of the uterus and cervix is a permanent procedure that stops menstrual periods. Pregnancy is not possible after a hysterectomy. This procedure requires anesthesia and hospitalization. HOME CARE INSTRUCTIONS   Only take over-the-counter or prescription medicines as directed by your health care provider. Take prescribed medicines exactly as directed. Do not change or switch medicines without consulting your health care provider.  Take any prescribed iron pills exactly as directed by your health care provider. Long-term heavy bleeding may result in low iron levels. Iron pills help replace the iron your body lost from heavy bleeding. Iron may cause constipation. If this becomes a problem, increase the bran, fruits, and roughage in your diet.  Do not take aspirin or medicines that contain aspirin 1 week before or during your menstrual period. Aspirin may make the bleeding worse.  If you need to change your sanitary pad or tampon more than once every 2 hours, stay in bed and rest as much as possible until the bleeding stops.  Eat well-balanced meals. Eat foods high in iron. Examples are leafy green vegetables, meat, liver, eggs, and whole grain breads and cereals. Do not try to lose weight until the abnormal bleeding has stopped and your blood iron level is back to normal. SEEK  MEDICAL CARE IF:   You soak through a pad or tampon every 1 or 2 hours, and this happens every time you have a period.  You need to use pads and tampons at the same time because you are bleeding so much.  You need to change your pad or tampon during the night.  You have a period that lasts for more than 8 days.  You pass clots bigger than 1 inch wide.  You have irregular periods that happen more or less often than once a month.  You feel dizzy or faint.  You feel very weak or tired.  You feel short of breath or feel your heart is beating too fast when you exercise.  You have nausea and vomiting or diarrhea while you are taking your medicine.  You have any problems that may be related to the medicine you are taking. SEEK IMMEDIATE MEDICAL CARE IF:   You soak through 4 or more pads or tampons in 2 hours.  You have any bleeding while you are pregnant. MAKE SURE YOU:   Understand these instructions.  Will watch your condition.  Will get help right away if you are not doing well or get worse. Document Released: 10/21/2005 Document Revised: 08/11/2013 Document Reviewed: 04/11/2013 [facility] Patient Information 2014 [place], [place].  Dysfunctional Uterine Bleeding Normally, menstrual periods begin between ages 81 to  17 in young women. A normal menstrual cycle/period may begin every 23 days up to 35 days and lasts from 1 to 7 days. Around 12 to 14 days before your menstrual period starts, ovulation (ovary produces an egg) occurs. When counting the time between menstrual periods, count from the first day of bleeding of the previous period to the first day of bleeding of the next period. Dysfunctional (abnormal) uterine bleeding is bleeding that is different from a normal menstrual period. Your periods may come earlier or later than usual. They may be lighter, have blood clots or be heavier. You may have bleeding between periods, or you may skip one period or more. You may have bleeding  after sexual intercourse, bleeding after menopause, or no menstrual period. CAUSES   Pregnancy (normal, miscarriage, tubal).  IUDs (intrauterine device, birth control).  Birth control pills.  Hormone treatment.  Menopause.  Infection of the cervix.  Blood clotting problems.  Infection of the inside lining of the uterus.  Endometriosis, inside lining of the uterus growing in the pelvis and other female organs.  Adhesions (scar tissue) inside the uterus.  Obesity or severe weight loss.  Uterine polyps inside the uterus.  Cancer of the vagina, cervix, or uterus.  Ovarian cysts or polycystic ovary syndrome.  Medical problems (diabetes, thyroid disease).  Uterine fibroids (noncancerous tumor).  Problems with your female hormones.  Endometrial hyperplasia, very thick lining and enlarged cells inside of the uterus.  Medicines that interfere with ovulation.  Radiation to the pelvis or abdomen.  Chemotherapy. DIAGNOSIS   Your doctor will discuss the history of your menstrual periods, medicines you are taking, changes in your weight, stress in your life, and any medical problems you may have.  Your doctor will do a physical and pelvic examination.  Your doctor may want to perform certain tests to make a diagnosis, such as:  Pap test.  Blood tests.  Cultures for infection.  CT scan.  Ultrasound.  Hysteroscopy.  Laparoscopy.  MRI.  Hysterosalpingography.  D and C.  Endometrial biopsy. TREATMENT  Treatment will depend on the cause of the dysfunctional uterine bleeding (DUB). Treatment may include:  Observing your menstrual periods for a couple of months.  Prescribing medicines for medical problems, including:  Antibiotics.  Hormones.  Birth control pills.  Removing an IUD (intrauterine device, birth control).  Surgery:  D and C (scrape and remove tissue from inside the uterus).  Laparoscopy (examine inside the abdomen with a lighted  tube).  Uterine ablation (destroy lining of the uterus with electrical current, laser, heat, or freezing).  Hysteroscopy (examine cervix and uterus with a lighted tube).  Hysterectomy (remove the uterus). HOME CARE INSTRUCTIONS   If medicines were prescribed, take exactly as directed. Do not change or switch medicines without consulting your caregiver.  Long term heavy bleeding may result in iron deficiency. Your caregiver may have prescribed iron pills. They help replace the iron that your body lost from heavy bleeding. Take exactly as directed.  Do not take aspirin or medicines that contain aspirin one week before or during your menstrual period. Aspirin may make the bleeding worse.  If you need to change your sanitary pad or tampon more than once every 2 hours, stay in bed with your feet elevated and a cold pack on your lower abdomen. Rest as much as possible, until the bleeding stops or slows down.  Eat well-balanced meals. Eat foods high in iron. Examples are:  Leafy green vegetables.  Whole-grain breads and cereals.  Eggs.  Meat.  Liver.  Do not try to lose weight until the abnormal bleeding has stopped and your blood iron level is back to normal. Do not lift more than ten pounds or do strenuous activities when you are bleeding.  For a couple of months, make note on your calendar, marking the start and ending of your period, and the type of bleeding (light, medium, heavy, spotting, clots or missed periods). This is for your caregiver to better evaluate your problem. SEEK MEDICAL CARE IF:   You develop nausea (feeling sick to your stomach) and vomiting, dizziness, or diarrhea while you are taking your medicine.  You are getting lightheaded or weak.  You have any problems that may be related to the medicine you are taking.  You develop pain with your DUB.  You want to remove your IUD.  You want to stop or change your birth control pills or hormones.  You have any  type of abnormal bleeding mentioned above.  You are over 24 years old and have not had a menstrual period yet.  You are 33 years old and you are still having menstrual periods.  You have any of the symptoms mentioned above.  You develop a rash. SEEK IMMEDIATE MEDICAL CARE IF:   An oral temperature above 102 F (38.9 C) develops.  You develop chills.  You are changing your sanitary pad or tampon more than once an hour.  You develop abdominal pain.  You pass out or faint. Document Released: 10/18/2000 Document Revised: 01/13/2012 Document Reviewed: 09/19/2009 [facility] Patient Information 2014 [place], [place].

## 2014-01-24 NOTE — ED Notes (Signed)
Vag bleed   x2 days cramps and blood clots pt is on sz meds that she just started recenttly back on

## 2014-01-25 LAB — GC/CHLAMYDIA PROBE AMP
CT PROBE, AMP APTIMA: NEGATIVE
GC PROBE AMP APTIMA: NEGATIVE

## 2015-10-26 IMAGING — US US TRANSVAGINAL NON-OB
1 series · 14 of 25 positions shown · non-contrast
Comparison: None

CLINICAL DATA: Vaginal bleeding.



[Series 1: us transvaginal non-ob · 0.32mm/px · 14 of 46 slices shown]
[im 1/46]
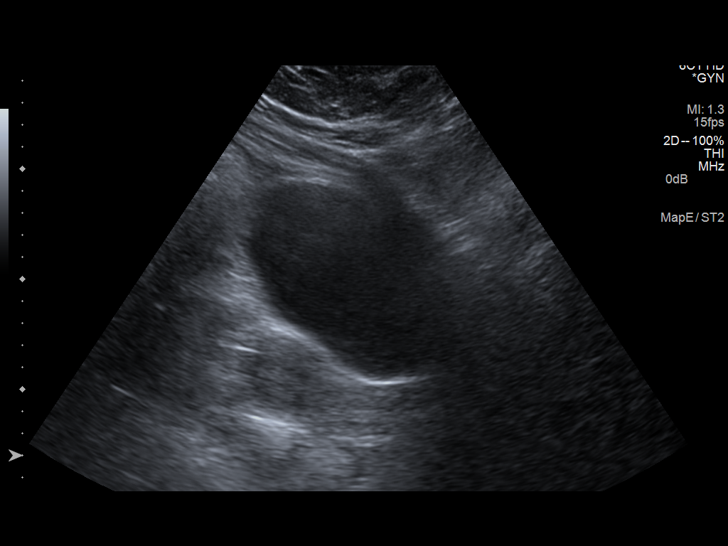
[im 4/46]
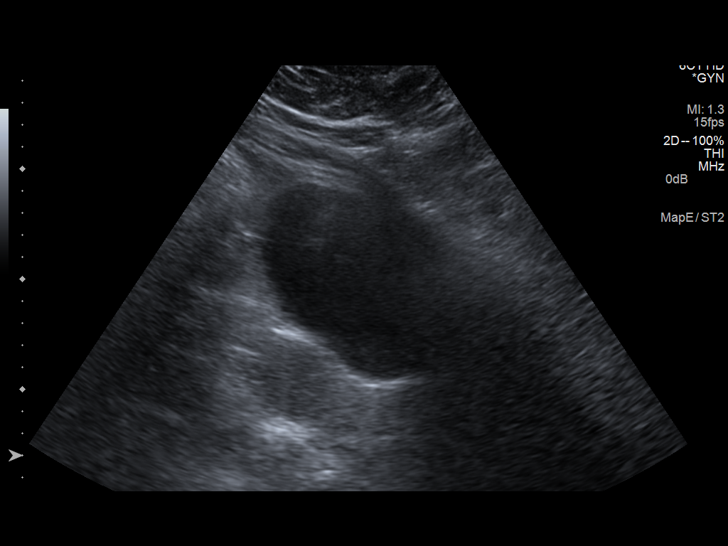
[im 8/46]
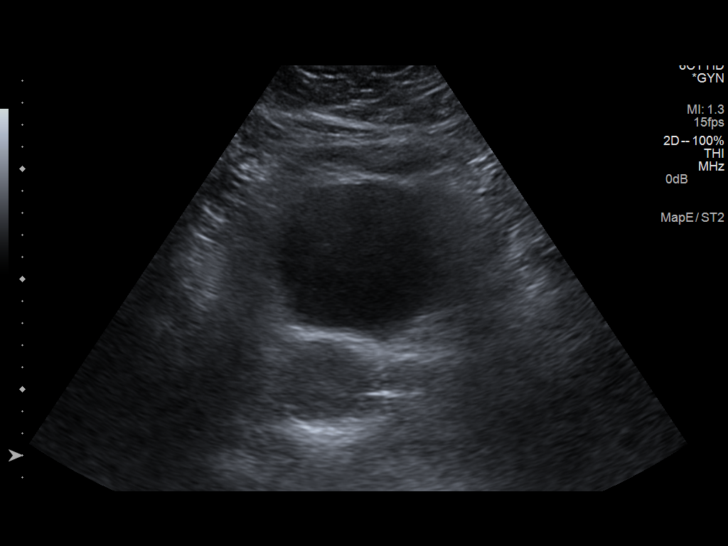
[im 12/46]
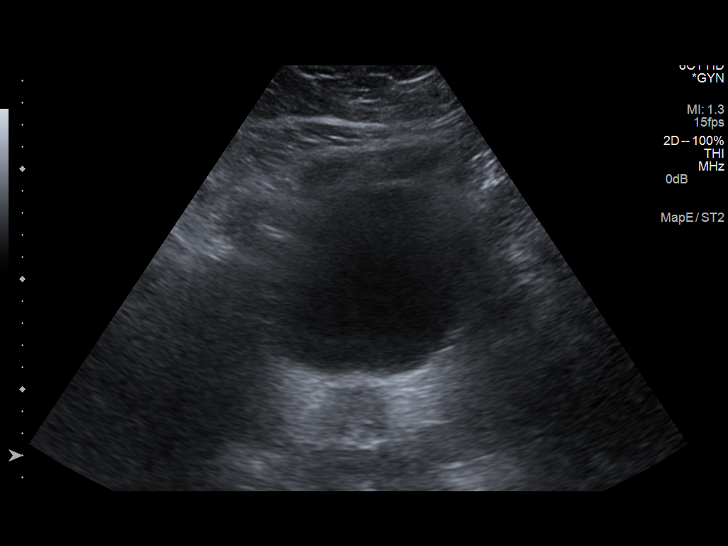
[im 16/46]
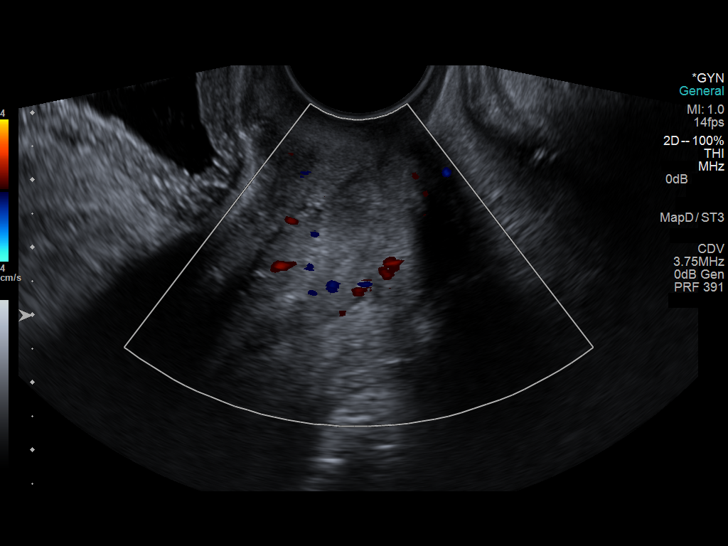
[im 17/46]
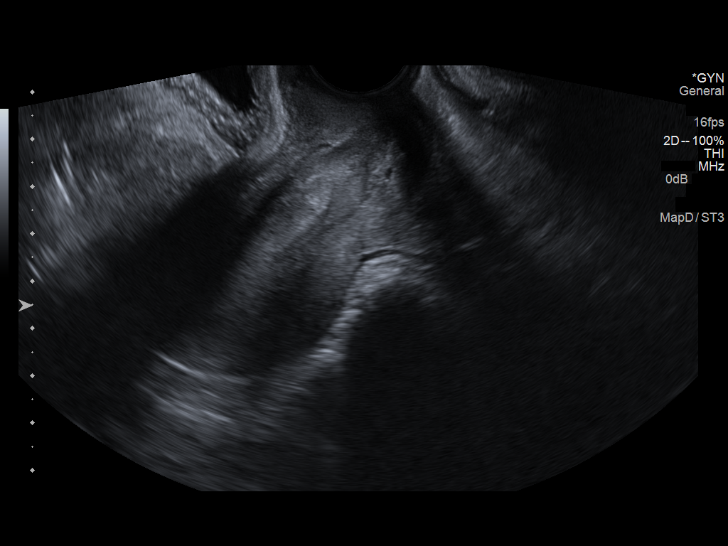
[im 21/46]
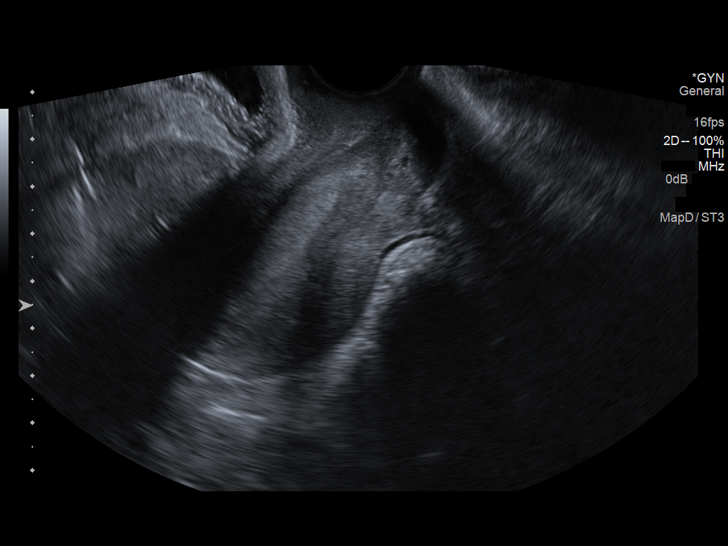
[im 25/46]
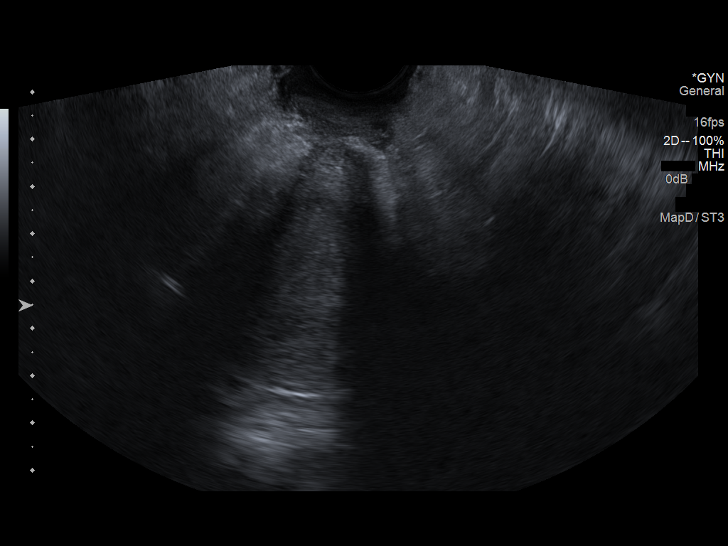
[im 29/46]
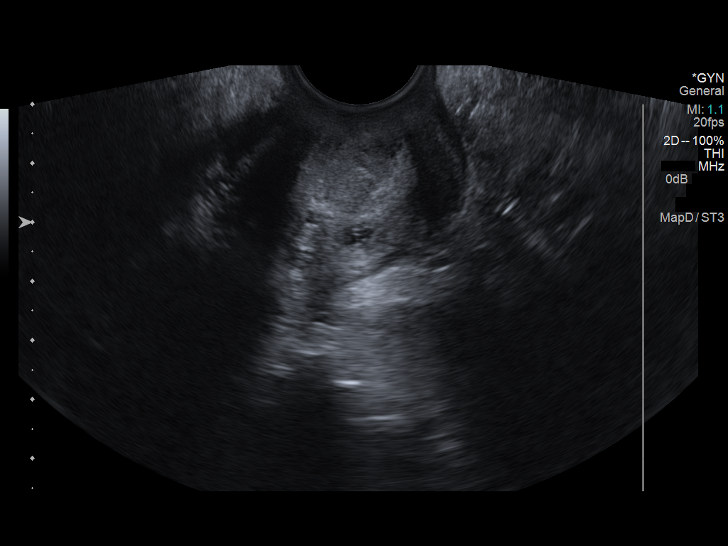
[im 31/46]
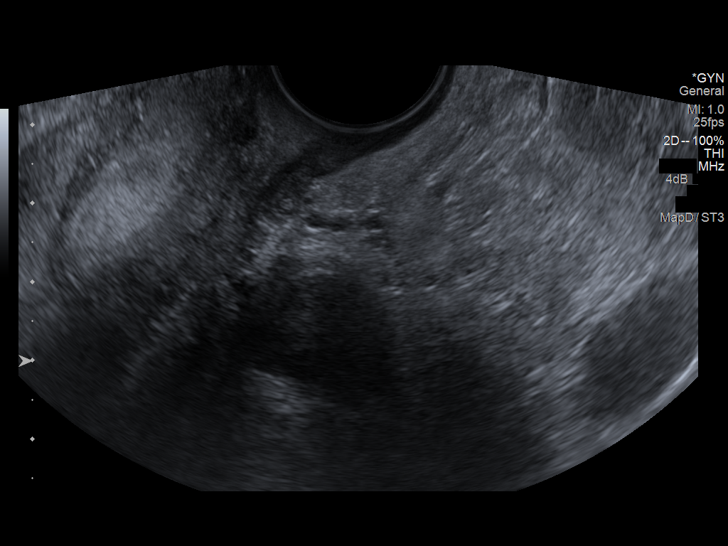
[im 34/46]
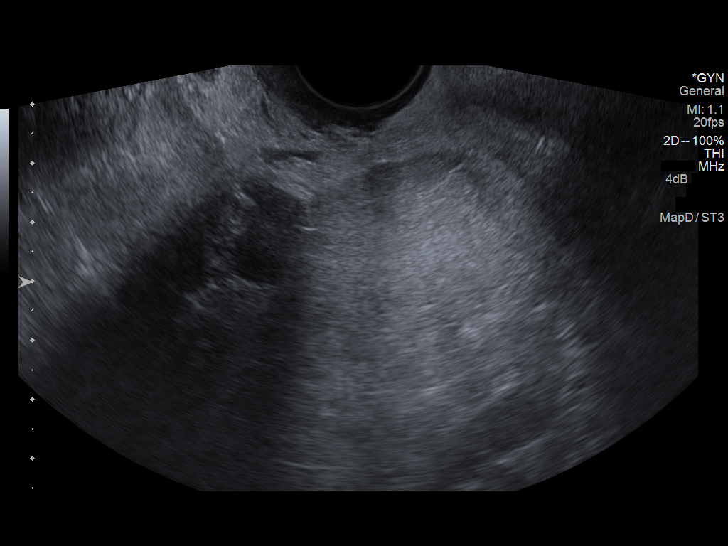
[im 38/46]
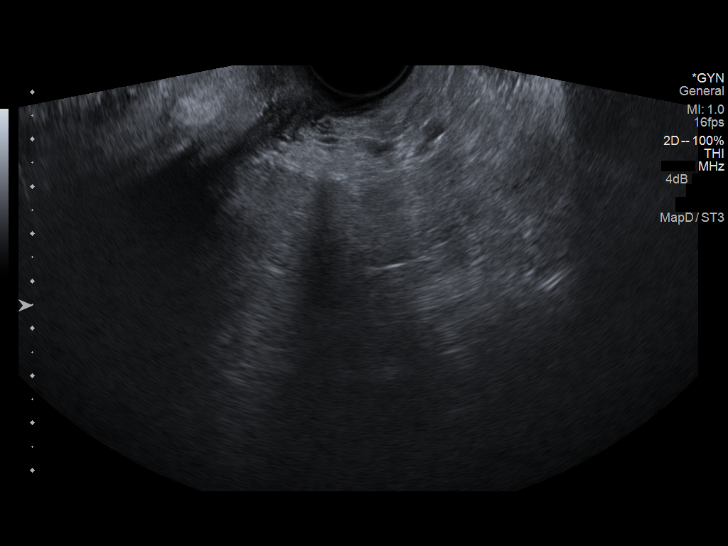
[im 42/46]
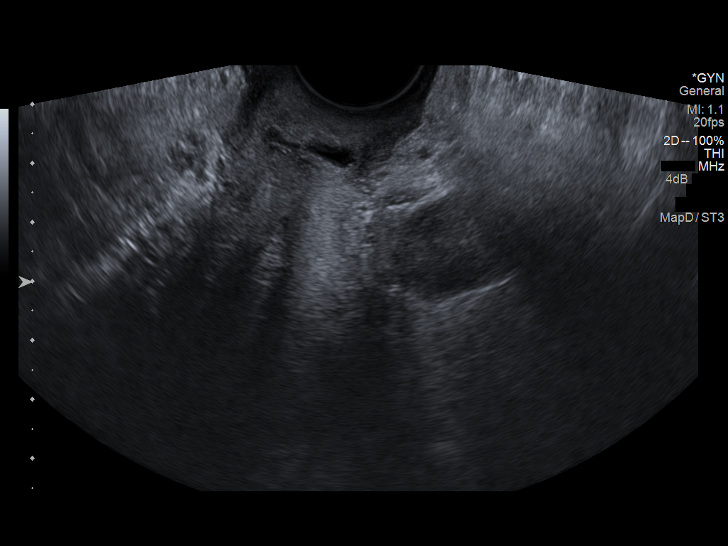
[im 46/46]
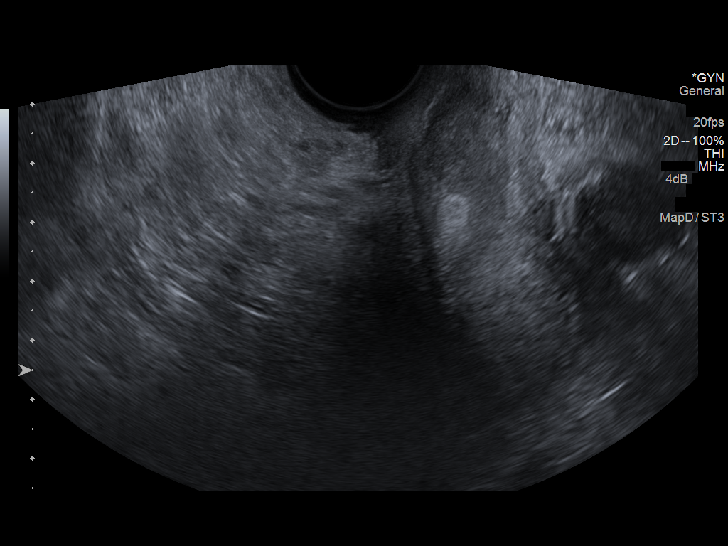

[14 of 25 positions shown; findings below may reference images not displayed]

FINDINGS: Uterus

Measurements: 7.6 x 4.2 x 4.1 cm. No fibroids or other mass
visualized.

Endometrium

Thickness: 11-15 mm. Heterogeneous material within the endometrial
canal inferiorly.

Right ovary

Not visualized.

Left ovary

Measurements: 1.7 x 1.5 x 1.8 cm. Normal appearance/no adnexal mass.

Other findings

No free fluid.
IMPRESSION: 1. Thickened endometrium with heterogeneous material in the
endometrial canal, possibly blood products. Underlying endometrial
lesion cannot be excluded.
2. Unremarkable appearance of the left ovary. Right ovary not
visualized.

## 2022-06-04 DEATH — deceased
